# Patient Record
Sex: Male | Born: 1985 | Hispanic: Yes | Marital: Single | Smoking: Current every day smoker
Health system: Southern US, Community
[De-identification: ages and names within clinical notes are randomized; demographics above are authoritative.]

## PROBLEM LIST (undated history)

## (undated) DIAGNOSIS — M6282 Rhabdomyolysis: Secondary | ICD-10-CM

## (undated) DIAGNOSIS — R51 Headache: Secondary | ICD-10-CM

## (undated) DIAGNOSIS — Z87448 Personal history of other diseases of urinary system: Secondary | ICD-10-CM

## (undated) DIAGNOSIS — G43909 Migraine, unspecified, not intractable, without status migrainosus: Secondary | ICD-10-CM

## (undated) DIAGNOSIS — R519 Headache, unspecified: Secondary | ICD-10-CM

## (undated) DIAGNOSIS — N179 Acute kidney failure, unspecified: Secondary | ICD-10-CM

## (undated) HISTORY — PX: NO PAST SURGERIES: SHX2092

---

## 2015-04-29 ENCOUNTER — Emergency Department (HOSPITAL_COMMUNITY)
Admission: EM | Admit: 2015-04-29 | Discharge: 2015-04-29 | Disposition: A | Discharge status: Home / Self Care | Payer: No Typology Code available for payment source | Attending: Emergency Medicine | Admitting: Emergency Medicine

## 2015-04-29 ENCOUNTER — Emergency Department (HOSPITAL_COMMUNITY): Payer: No Typology Code available for payment source

## 2015-04-29 ENCOUNTER — Encounter (HOSPITAL_COMMUNITY): Payer: Self-pay | Admitting: *Deleted

## 2015-04-29 DIAGNOSIS — S8991XA Unspecified injury of right lower leg, initial encounter: Secondary | ICD-10-CM | POA: Diagnosis not present

## 2015-04-29 DIAGNOSIS — Y998 Other external cause status: Secondary | ICD-10-CM | POA: Diagnosis not present

## 2015-04-29 DIAGNOSIS — M25561 Pain in right knee: Secondary | ICD-10-CM

## 2015-04-29 DIAGNOSIS — T148 Other injury of unspecified body region: Secondary | ICD-10-CM | POA: Insufficient documentation

## 2015-04-29 DIAGNOSIS — T148XXA Other injury of unspecified body region, initial encounter: Secondary | ICD-10-CM

## 2015-04-29 DIAGNOSIS — S199XXA Unspecified injury of neck, initial encounter: Secondary | ICD-10-CM | POA: Diagnosis not present

## 2015-04-29 DIAGNOSIS — Y92410 Unspecified street and highway as the place of occurrence of the external cause: Secondary | ICD-10-CM | POA: Diagnosis not present

## 2015-04-29 DIAGNOSIS — Y9389 Activity, other specified: Secondary | ICD-10-CM | POA: Diagnosis not present

## 2015-04-29 MED ORDER — IBUPROFEN 800 MG PO TABS: 800.0000 mg | ORAL_TABLET | Freq: Three times a day (TID) | ORAL | Status: DC

## 2015-04-29 MED ORDER — BACLOFEN 10 MG PO TABS: 10.0000 mg | ORAL_TABLET | Freq: Three times a day (TID) | ORAL | Status: AC

## 2015-04-29 MED ORDER — IBUPROFEN 800 MG PO TABS
800.0000 mg | ORAL_TABLET | Freq: Once | ORAL | Status: AC
  Administered 2015-04-29: 800 mg via ORAL
  Filled 2015-04-29: qty 1

## 2015-04-29 MED ORDER — METHOCARBAMOL 500 MG PO TABS
1000.0000 mg | ORAL_TABLET | Freq: Once | ORAL | Status: AC
  Administered 2015-04-29: 1000 mg via ORAL
  Filled 2015-04-29: qty 2
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
## 2015-05-06 ENCOUNTER — Emergency Department (INDEPENDENT_AMBULATORY_CARE_PROVIDER_SITE_OTHER)
Admission: EM | Admit: 2015-05-06 | Discharge: 2015-05-06 | Disposition: A | Discharge status: Home / Self Care | Payer: Self-pay | Source: Home / Self Care | Attending: Family Medicine | Admitting: Family Medicine

## 2015-05-06 ENCOUNTER — Encounter (HOSPITAL_COMMUNITY): Payer: Self-pay | Admitting: Emergency Medicine

## 2015-05-06 DIAGNOSIS — M62830 Muscle spasm of back: Secondary | ICD-10-CM

## 2015-05-06 DIAGNOSIS — S39012D Strain of muscle, fascia and tendon of lower back, subsequent encounter: Secondary | ICD-10-CM

## 2015-05-06 MED ORDER — METHOCARBAMOL 500 MG PO TABS: 500.0000 mg | ORAL_TABLET | Freq: Three times a day (TID) | ORAL | Status: DC

## 2015-05-06 MED ORDER — METHYLPREDNISOLONE ACETATE 40 MG/ML IJ SUSP
INTRAMUSCULAR | Status: AC
  Filled 2015-05-06: qty 1

## 2015-05-06 MED ORDER — TRAMADOL HCL 50 MG PO TABS
50.0000 mg | ORAL_TABLET | Freq: Four times a day (QID) | ORAL | Status: DC | PRN
Start: 2015-05-06 — End: 2015-07-26

## 2015-05-06 MED ORDER — KETOROLAC TROMETHAMINE 60 MG/2ML IM SOLN
INTRAMUSCULAR | Status: AC
  Filled 2015-05-06: qty 2

## 2015-05-06 MED ORDER — DICLOFENAC POTASSIUM 50 MG PO TABS: 50.0000 mg | ORAL_TABLET | Freq: Three times a day (TID) | ORAL | Status: DC

## 2015-05-06 MED ORDER — KETOROLAC TROMETHAMINE 60 MG/2ML IM SOLN
60.0000 mg | Freq: Once | INTRAMUSCULAR | Status: AC
  Administered 2015-05-06: 60 mg via INTRAMUSCULAR

## 2015-05-06 MED ORDER — METHYLPREDNISOLONE ACETATE 40 MG/ML IJ SUSP
40.0000 mg | Freq: Once | INTRAMUSCULAR | Status: AC
  Administered 2015-05-06: 40 mg via INTRAMUSCULAR
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
## 2015-07-25 ENCOUNTER — Encounter (HOSPITAL_BASED_OUTPATIENT_CLINIC_OR_DEPARTMENT_OTHER): Payer: Self-pay | Admitting: *Deleted

## 2015-07-25 ENCOUNTER — Observation Stay (HOSPITAL_COMMUNITY): Payer: Self-pay

## 2015-07-25 ENCOUNTER — Inpatient Hospital Stay (HOSPITAL_BASED_OUTPATIENT_CLINIC_OR_DEPARTMENT_OTHER)
Admission: EM | Admit: 2015-07-25 | Discharge: 2015-07-27 | DRG: 683 | Disposition: A | Discharge status: Home / Self Care | Payer: Self-pay | Attending: Internal Medicine | Admitting: Internal Medicine

## 2015-07-25 DIAGNOSIS — M6282 Rhabdomyolysis: Secondary | ICD-10-CM

## 2015-07-25 DIAGNOSIS — G43909 Migraine, unspecified, not intractable, without status migrainosus: Secondary | ICD-10-CM | POA: Diagnosis present

## 2015-07-25 DIAGNOSIS — R1032 Left lower quadrant pain: Secondary | ICD-10-CM

## 2015-07-25 DIAGNOSIS — E860 Dehydration: Secondary | ICD-10-CM | POA: Diagnosis present

## 2015-07-25 DIAGNOSIS — Z23 Encounter for immunization: Secondary | ICD-10-CM

## 2015-07-25 DIAGNOSIS — F17210 Nicotine dependence, cigarettes, uncomplicated: Secondary | ICD-10-CM | POA: Diagnosis present

## 2015-07-25 DIAGNOSIS — N179 Acute kidney failure, unspecified: Principal | ICD-10-CM | POA: Diagnosis present

## 2015-07-25 DIAGNOSIS — K529 Noninfective gastroenteritis and colitis, unspecified: Secondary | ICD-10-CM | POA: Diagnosis present

## 2015-07-25 DIAGNOSIS — N400 Enlarged prostate without lower urinary tract symptoms: Secondary | ICD-10-CM | POA: Diagnosis present

## 2015-07-25 HISTORY — DX: Headache: R51

## 2015-07-25 HISTORY — DX: Headache, unspecified: R51.9

## 2015-07-25 HISTORY — DX: Migraine, unspecified, not intractable, without status migrainosus: G43.909

## 2015-07-25 LAB — URINALYSIS, ROUTINE W REFLEX MICROSCOPIC
Glucose, UA: NEGATIVE mg/dL
Hgb urine dipstick: NEGATIVE
KETONES UR: 15 mg/dL — AB
Leukocytes, UA: NEGATIVE
NITRITE: NEGATIVE
PH: 5.5 (ref 5.0–8.0)
Protein, ur: NEGATIVE mg/dL
Specific Gravity, Urine: 1.029 (ref 1.005–1.030)
Urobilinogen, UA: 0.2 mg/dL (ref 0.0–1.0)

## 2015-07-25 LAB — CBC WITH DIFFERENTIAL/PLATELET
BASOS ABS: 0 10*3/uL (ref 0.0–0.1)
BASOS PCT: 0 % (ref 0–1)
Eosinophils Absolute: 0.1 10*3/uL (ref 0.0–0.7)
Eosinophils Relative: 1 % (ref 0–5)
HCT: 45.8 % (ref 39.0–52.0)
Hemoglobin: 16.4 g/dL (ref 13.0–17.0)
LYMPHS PCT: 38 % (ref 12–46)
Lymphs Abs: 2.9 10*3/uL (ref 0.7–4.0)
MCH: 30.9 pg (ref 26.0–34.0)
MCHC: 35.8 g/dL (ref 30.0–36.0)
MCV: 86.4 fL (ref 78.0–100.0)
Monocytes Absolute: 0.6 10*3/uL (ref 0.1–1.0)
Monocytes Relative: 8 % (ref 3–12)
NEUTROS ABS: 4 10*3/uL (ref 1.7–7.7)
Neutrophils Relative %: 53 % (ref 43–77)
Platelets: 368 10*3/uL (ref 150–400)
RBC: 5.3 MIL/uL (ref 4.22–5.81)
RDW: 12.5 % (ref 11.5–15.5)
WBC: 7.7 10*3/uL (ref 4.0–10.5)

## 2015-07-25 LAB — CBC
HCT: 39.2 % (ref 39.0–52.0)
Hemoglobin: 13.8 g/dL (ref 13.0–17.0)
MCH: 31 pg (ref 26.0–34.0)
MCHC: 35.2 g/dL (ref 30.0–36.0)
MCV: 88.1 fL (ref 78.0–100.0)
PLATELETS: 285 10*3/uL (ref 150–400)
RBC: 4.45 MIL/uL (ref 4.22–5.81)
RDW: 12.7 % (ref 11.5–15.5)
WBC: 7.1 10*3/uL (ref 4.0–10.5)

## 2015-07-25 LAB — BASIC METABOLIC PANEL
Anion gap: 12 (ref 5–15)
BUN: 37 mg/dL — ABNORMAL HIGH (ref 6–20)
CALCIUM: 9.7 mg/dL (ref 8.9–10.3)
CO2: 29 mmol/L (ref 22–32)
Chloride: 93 mmol/L — ABNORMAL LOW (ref 101–111)
Creatinine, Ser: 2.09 mg/dL — ABNORMAL HIGH (ref 0.61–1.24)
GFR calc non Af Amer: 41 mL/min — ABNORMAL LOW (ref >60)
GFR, EST AFRICAN AMERICAN: 48 mL/min — AB (ref >60)
Glucose, Bld: 97 mg/dL (ref 65–99)
POTASSIUM: 4 mmol/L (ref 3.5–5.1)
Sodium: 134 mmol/L — ABNORMAL LOW (ref 135–145)

## 2015-07-25 LAB — RAPID URINE DRUG SCREEN, HOSP PERFORMED
AMPHETAMINES: POSITIVE — AB
BENZODIAZEPINES: NOT DETECTED
Barbiturates: NOT DETECTED
COCAINE: NOT DETECTED
Opiates: NOT DETECTED
Tetrahydrocannabinol: NOT DETECTED

## 2015-07-25 LAB — CREATININE, SERUM
CREATININE: 1.42 mg/dL — AB (ref 0.61–1.24)
GFR calc Af Amer: >60 mL/min (ref >60)

## 2015-07-25 LAB — CK: Total CK: 896 U/L — ABNORMAL HIGH (ref 49–397)

## 2015-07-25 MED ORDER — ACETAMINOPHEN 650 MG RE SUPP: 650.0000 mg | Freq: Four times a day (QID) | RECTAL | Status: DC | PRN

## 2015-07-25 MED ORDER — SODIUM CHLORIDE 0.9 % IV SOLN
INTRAVENOUS | Status: AC
  Administered 2015-07-25 – 2015-07-26 (×2): via INTRAVENOUS

## 2015-07-25 MED ORDER — ONDANSETRON HCL 4 MG PO TABS: 4.0000 mg | ORAL_TABLET | Freq: Four times a day (QID) | ORAL | Status: DC | PRN

## 2015-07-25 MED ORDER — SODIUM CHLORIDE 0.9 % IV SOLN: 1000.0000 mL | Freq: Once | INTRAVENOUS | Status: DC

## 2015-07-25 MED ORDER — ONDANSETRON HCL 4 MG/2ML IJ SOLN: 4.0000 mg | Freq: Four times a day (QID) | INTRAMUSCULAR | Status: DC | PRN

## 2015-07-25 MED ORDER — ONDANSETRON HCL 4 MG/2ML IJ SOLN
4.0000 mg | Freq: Once | INTRAMUSCULAR | Status: AC
  Administered 2015-07-25: 4 mg via INTRAVENOUS
  Filled 2015-07-25: qty 2

## 2015-07-25 MED ORDER — ACETAMINOPHEN 325 MG PO TABS: 650.0000 mg | ORAL_TABLET | Freq: Four times a day (QID) | ORAL | Status: DC | PRN

## 2015-07-25 MED ORDER — ENOXAPARIN SODIUM 40 MG/0.4ML ~~LOC~~ SOLN
40.0000 mg | SUBCUTANEOUS | Status: DC
  Administered 2015-07-25 – 2015-07-26 (×2): 40 mg via SUBCUTANEOUS
  Filled 2015-07-25 (×2): qty 0.4

## 2015-07-25 MED ORDER — SODIUM CHLORIDE 0.9 % IV SOLN: INTRAVENOUS | Status: DC

## 2015-07-25 MED ORDER — SODIUM CHLORIDE 0.9 % IV SOLN
1000.0000 mL | INTRAVENOUS | Status: DC
  Administered 2015-07-25 (×2): 1000 mL via INTRAVENOUS

## 2015-07-25 MED ORDER — IOHEXOL 300 MG/ML  SOLN: 25.0000 mL | INTRAMUSCULAR | Status: AC

## 2015-07-25 MED ORDER — SODIUM CHLORIDE 0.9 % IV SOLN
1000.0000 mL | Freq: Once | INTRAVENOUS | Status: AC
  Administered 2015-07-25: 1000 mL via INTRAVENOUS

## 2015-07-25 MED ORDER — PNEUMOCOCCAL VAC POLYVALENT 25 MCG/0.5ML IJ INJ
0.5000 mL | INJECTION | INTRAMUSCULAR | Status: AC
  Administered 2015-07-26: 0.5 mL via INTRAMUSCULAR
  Filled 2015-07-25: qty 0.5

## 2015-07-25 NOTE — ED Notes (Signed)
MD at bedside. 

## 2015-07-25 NOTE — ED Provider Notes (Signed)
CSN: [id number]     Arrival date & time 07/25/15  1630 History   First MD Initiated Contact with Patient 07/25/15 1632     Chief Complaint  Patient presents with  . Headache     (Consider location/radiation/quality/duration/timing/severity/associated sxs/prior Treatment) HPI Comments: 29 year old male presenting with multiple complaints. He's had a headache for the past 2 weeks described as his "brain cramping" in the back of his head with associated nausea and photophobia. He then started to develop generalized abdominal pain over the past week followed by nausea and vomiting beginning yesterday. Reports 2 episodes of vomiting yesterday and 2 episodes of vomiting today, one of which was blood-tinged. Has a decreased appetite. 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 Reports muscle cramping and states his muscles are very tender to touch. No extremity swelling. No rashes. Denies neck pain or stiffness.  Patient is a 29 y.o. male presenting with headaches. The history is provided by the patient.  Headache Associated symptoms: abdominal pain, myalgias, nausea, photophobia and vomiting     History reviewed. No pertinent past medical history. History reviewed. No pertinent past surgical history. No family history on file. Social History  Substance Use Topics  . Smoking status: Current Every Day Smoker    Types: Cigarettes  . Smokeless tobacco: None  . Alcohol Use: Yes    Review of Systems  Constitutional: Positive for appetite change.  Eyes: Positive for photophobia.  Gastrointestinal: Positive for nausea, vomiting and abdominal pain.  Genitourinary: Positive for decreased urine volume.       + Dark urine.  Musculoskeletal: Positive for myalgias.  Neurological: Positive for headaches.  All other systems reviewed and are negative.     Allergies  Review of patient's allergies indicates no known  allergies.  Home Medications   Prior to Admission medications   Medication Sig Start Date End Date Taking? Authorizing Provider  diclofenac (CATAFLAM) 50 MG tablet Take 1 tablet (50 mg total) by mouth 3 (three) times daily. One tablet TID with food prn pain. 05/06/15   Hayden Rasmussen, NP  ibuprofen (ADVIL,MOTRIN) 800 MG tablet Take 1 tablet (800 mg total) by mouth 3 (three) times daily. 04/29/15   Ivery Quale, PA-C  methocarbamol (ROBAXIN) 500 MG tablet Take 1 tablet (500 mg total) by mouth 3 (three) times daily. Prn muscle spasm.  May cause drowsiness. 05/06/15   Hayden Rasmussen, NP  traMADol (ULTRAM) 50 MG tablet Take 1 tablet (50 mg total) by mouth every 6 (six) hours as needed. For back pain. May cause drowsiness. 05/06/15   Hayden Rasmussen, NP   BP 114/74 mmHg  Pulse 80  Temp(Src) 98.1 F (36.7 C) (Oral)  Resp 16  Ht  (1.651 m)  Wt 140 lb (63.504 kg)  BMI 23.30 kg/m2  SpO2 100% Physical Exam  Constitutional: He is oriented to person, place, and time. He appears well-developed and well-nourished. No distress.  HENT:  Head: Normocephalic and atraumatic.  Eyes: Conjunctivae and EOM are normal. Pupils are equal, round, and reactive to light.  Neck: Normal range of motion. Neck supple.  No meningeal signs.  Cardiovascular: Normal rate, regular rhythm and normal heart sounds.   Pulmonary/Chest: Effort normal and breath sounds normal. No respiratory distress.  Abdominal:  Generalized tenderness. "Muscle soreness" per pt.  Musculoskeletal: Normal range of motion. He exhibits no edema.  Generalized tenderness to BL upper and lower extremities. No edema.  Neurological:  He is alert and oriented to person, place, and time. He has normal strength. No cranial nerve deficit. Coordination normal. GCS eye subscore is 4. GCS verbal subscore is 5. GCS motor subscore is 6.  Skin: Skin is warm and dry. No rash noted.  Psychiatric: He has a normal mood and affect. His behavior is normal.  Nursing note and  vitals reviewed.   ED Course  Procedures (including critical care time) Labs Review Labs Reviewed  URINALYSIS, ROUTINE W REFLEX MICROSCOPIC (NOT AT [facility]) - Abnormal; Notable for the following:    Bilirubin Urine SMALL (*)    Ketones, ur 15 (*)    All other components within normal limits  BASIC METABOLIC PANEL - Abnormal; Notable for the following:    Sodium 134 (*)    Chloride 93 (*)    BUN 37 (*)    Creatinine, Ser 2.09 (*)    GFR calc non Af Amer 41 (*)    GFR calc Af Amer 48 (*)    All other components within normal limits  CK - Abnormal; Notable for the following:    Total CK 896 (*)    All other components within normal limits  CBC WITH DIFFERENTIAL/PLATELET    Imaging Review No results found. I have personally reviewed and evaluated these images and lab results as part of my medical decision-making.   EKG Interpretation None      MDM   Final diagnoses:  Acute kidney injury  Non-traumatic rhabdomyolysis   Non-toxic appearing, NAD. AFVSS. Has been outside working in the heat. Labs significant for AKI and concern for developing rhabdo. Will admit to IV hydration and monitoring, observation, medical med. Admission accepted by Dr. Clyde Lundborg, [facility].  Discussed with attending Dr. Rubin Payor who agrees with plan of care.   Kathrynn Speed, PA-C 07/25/15 1841  Benjiman Core, MD 07/25/15 2308

## 2015-07-25 NOTE — Progress Notes (Signed)
CT person here with oral contrast for patient to drink before CT abdomen. Patient eating sub and chips from [employer]. Paged Dr Toniann Fail re: above, MD called me back and said to just get CT without any contrast now. CT person advised of above. States will be back to get patient. Patient notified of impending CT scan with verbal understanding.

## 2015-07-25 NOTE — Progress Notes (Signed)
Patient arrived to room 5W10 from [facility] via [facility], ambulated from stretcher to bed without problems. SR up, call bell in reach. Oriented to room, call bell, bed controls, patient guide book with verbal understanding. New armband applied after verifying info with patient. Admission nurse in room at this time to do admission paperwork. Awaiting MD to see patient and give orders. Will monitor.

## 2015-07-25 NOTE — ED Notes (Signed)
Pt presents with HA, N/V, poor appetite, states been having a lot of leg cramps

## 2015-07-25 NOTE — H&P (Signed)
[facility] History and Physical  Javad Salva ZOX:[id number] DOB: 12-Aug-1986 DOA: 07/25/2015  Referring physician: Patient was transferred from [facility] [place]. PCP: No PCP Per Patient  Specialists: None.  Chief Complaint: Generalized bodyaches nausea vomiting.  HPI: Ricky Thompson is a 29 y.o. male with no significant past medical history presents to the ER because of generalized body ache muscle cramps nausea vomiting over the last 2 days. Patient states he has been having body aches for the last 3 weeks. Nausea vomiting started since yesterday. In the ER patient was found to have elevated creatinine and mildly elevated CK levels. Patient states he was having some dark urine before coming to the ER. Patient also states that a few months ago he was told he had an enlarged prostate. Patient was given IV boluses and admitted for further observation. Patient at this time is able to make urine. Patient denies any chest pain or shortness of breath. Still has cramps in the lower extremities.   Review of Systems: As presented in the history of presenting illness, rest negative.  Past Medical History  Diagnosis Date  . Daily headache   . Migraine     "a few times/week" (07/25/2015)  . Medical history non-contributory    Past Surgical History  Procedure Laterality Date  . No past surgeries     Social History:  reports that he has been smoking Cigarettes.  He has a .84 pack-year smoking history. His smokeless tobacco use includes Chew. He reports that he drinks about 3.6 oz of alcohol per week. He reports that he does not use illicit drugs. Where does patient live home. Can patient participate in ADLs? Yes.  No Known Allergies  Family History:  Family History  Problem Relation Age of Onset  . Diabetes Mellitus II Mother       Prior to Admission medications   Medication Sig Start Date End Date Taking? Authorizing Provider  diclofenac (CATAFLAM) 50 MG tablet Take 1 tablet (50 mg  total) by mouth 3 (three) times daily. One tablet TID with food prn pain. 05/06/15   Hayden Rasmussen, NP  ibuprofen (ADVIL,MOTRIN) 800 MG tablet Take 1 tablet (800 mg total) by mouth 3 (three) times daily. 04/29/15   Ivery Quale, PA-C  methocarbamol (ROBAXIN) 500 MG tablet Take 1 tablet (500 mg total) by mouth 3 (three) times daily. Prn muscle spasm.  May cause drowsiness. 05/06/15   Hayden Rasmussen, NP  traMADol (ULTRAM) 50 MG tablet Take 1 tablet (50 mg total) by mouth every 6 (six) hours as needed. For back pain. May cause drowsiness. 05/06/15   Hayden Rasmussen, NP    Physical Exam: Filed Vitals:   07/25/15 1800 07/25/15 1830 07/25/15 2000 07/25/15 2121  BP: 126/74 128/78 120/73 131/72  Pulse: 82 89 85 85  Temp:    98.2 F (36.8 C)  TempSrc:    Oral  Resp:   18 18  Height:      Weight:      SpO2: 100% 100% 100% 99%     General:  Moderately built and nourished.  Eyes: Anicteric no pallor.  ENT: No discharge from the ears eyes nose and mouth.  Neck: No mass felt.  Cardiovascular: S1 and S2 heard.  Respiratory: No rhonchi or crepitations.  Abdomen: Soft mild tenderness in the lower quadrants. No guarding or rigidity.  Skin: No rash.  Musculoskeletal: Nontender no edema.  Psychiatric: Appears normal.  Neurologic: Alert awake oriented to time place and person. Moves all extremities.  Labs on Admission:  Basic Metabolic Panel:  Recent Labs Lab 07/25/15 1700  NA 134*  K 4.0  CL 93*  CO2 29  GLUCOSE 97  BUN 37*  CREATININE 2.09*  CALCIUM 9.7   Liver Function Tests: No results for input(s): AST, ALT, ALKPHOS, BILITOT, PROT, ALBUMIN in the last 168 hours. No results for input(s): LIPASE, AMYLASE in the last 168 hours. No results for input(s): AMMONIA in the last 168 hours. CBC:  Recent Labs Lab 07/25/15 1700  WBC 7.7  NEUTROABS 4.0  HGB 16.4  HCT 45.8  MCV 86.4  PLT 368   Cardiac Enzymes:  Recent Labs Lab 07/25/15 1720  CKTOTAL 896*    BNP (last 3  results) No results for input(s): BNP in the last 8760 hours.  ProBNP (last 3 results) No results for input(s): PROBNP in the last 8760 hours.  CBG: No results for input(s): GLUCAP in the last 168 hours.  Radiological Exams on Admission: No results found.   Assessment/Plan Principal Problem:   Acute kidney injury Active Problems:   Non-traumatic rhabdomyolysis   ARF (acute renal failure)   1. Acute renal failure - probably secondary to dehydration and nausea vomiting. Continue with aggressive IV hydration. Follow metabolic panel intake output. 2. Nontraumatic rhabdomyolysis - continue with hydration and follow CK levels.  Since patient is having mild tenderness in all quadrants I have ordered CT abdomen and pelvis with by mouth contrast. Patient states he was told he had enlarged prostate for which she will need further workup as outpatient. Follow CT.   DVT Prophylaxis Lovenox.  Code Status: Full code.  Family Communication: Discussed with patient.  Disposition Plan: Admit for observation.    Cerissa Zeiger N. [facility] Pager [phone].  If 7PM-7AM, please contact night-coverage [email or web address] Password [facility] 07/25/2015, 10:25 PM

## 2015-07-25 NOTE — ED Notes (Signed)
rn on 5 w unable to take report at present

## 2015-07-25 NOTE — ED Notes (Signed)
Pt. Reports he has been cramping for 3 weeks and started vomiting yesterday and now is having headaches and nausea.  Pt. Reports no diarrhea.  Pt. Reports not eating well today.  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

## 2015-07-26 LAB — CBC WITH DIFFERENTIAL/PLATELET
BASOS ABS: 0 10*3/uL (ref 0.0–0.1)
Basophils Relative: 0 % (ref 0–1)
EOS PCT: 3 % (ref 0–5)
Eosinophils Absolute: 0.2 10*3/uL (ref 0.0–0.7)
HCT: 39.4 % (ref 39.0–52.0)
Hemoglobin: 13.6 g/dL (ref 13.0–17.0)
LYMPHS PCT: 58 % — AB (ref 12–46)
Lymphs Abs: 3.3 10*3/uL (ref 0.7–4.0)
MCH: 31 pg (ref 26.0–34.0)
MCHC: 34.5 g/dL (ref 30.0–36.0)
MCV: 89.7 fL (ref 78.0–100.0)
Monocytes Absolute: 0.4 10*3/uL (ref 0.1–1.0)
Monocytes Relative: 7 % (ref 3–12)
NEUTROS ABS: 1.9 10*3/uL (ref 1.7–7.7)
Neutrophils Relative %: 32 % — ABNORMAL LOW (ref 43–77)
PLATELETS: 273 10*3/uL (ref 150–400)
RBC: 4.39 MIL/uL (ref 4.22–5.81)
RDW: 12.7 % (ref 11.5–15.5)
WBC: 5.8 10*3/uL (ref 4.0–10.5)

## 2015-07-26 LAB — COMPREHENSIVE METABOLIC PANEL
ALT: 22 U/L (ref 17–63)
ANION GAP: 6 (ref 5–15)
AST: 26 U/L (ref 15–41)
Albumin: 3.6 g/dL (ref 3.5–5.0)
Alkaline Phosphatase: 52 U/L (ref 38–126)
BILIRUBIN TOTAL: 0.8 mg/dL (ref 0.3–1.2)
BUN: 23 mg/dL — AB (ref 6–20)
CO2: 27 mmol/L (ref 22–32)
Calcium: 8.2 mg/dL — ABNORMAL LOW (ref 8.9–10.3)
Chloride: 102 mmol/L (ref 101–111)
Creatinine, Ser: 1.29 mg/dL — ABNORMAL HIGH (ref 0.61–1.24)
GFR calc Af Amer: >60 mL/min (ref >60)
Glucose, Bld: 94 mg/dL (ref 65–99)
POTASSIUM: 4.2 mmol/L (ref 3.5–5.1)
Sodium: 135 mmol/L (ref 135–145)
TOTAL PROTEIN: 5.7 g/dL — AB (ref 6.5–8.1)

## 2015-07-26 LAB — RAPID URINE DRUG SCREEN, HOSP PERFORMED
AMPHETAMINES: POSITIVE — AB
BARBITURATES: POSITIVE — AB
BENZODIAZEPINES: NOT DETECTED
Cocaine: NOT DETECTED
Opiates: NOT DETECTED
TETRAHYDROCANNABINOL: NOT DETECTED

## 2015-07-26 LAB — LIPASE, BLOOD: Lipase: 38 U/L (ref 22–51)

## 2015-07-26 LAB — CK: CK TOTAL: 777 U/L — AB (ref 49–397)

## 2015-07-26 MED ORDER — BUTALBITAL-APAP-CAFFEINE 50-325-40 MG PO TABS
1.0000 | ORAL_TABLET | ORAL | Status: DC | PRN
  Administered 2015-07-26 (×2): 1 via ORAL
  Filled 2015-07-26 (×2): qty 1

## 2015-07-26 MED ORDER — METOCLOPRAMIDE HCL 5 MG/ML IJ SOLN
10.0000 mg | Freq: Once | INTRAMUSCULAR | Status: AC
  Administered 2015-07-26: 10 mg via INTRAVENOUS
  Filled 2015-07-26: qty 2

## 2015-07-26 NOTE — Progress Notes (Signed)
[facility] PROGRESS NOTE  Kyon Bentler QMV:[id number] DOB: 13-Jun-1986 DOA: 07/25/2015 PCP: No PCP Per Patient  Assessment/Plan: Principal Problem:   Acute kidney injury Active Problems:   Non-traumatic rhabdomyolysis   ARF (acute renal failure)    Acute renal failure, secondary to rhabdomyolysis-improving, continue IV fluids  Abdominal pain, nausea vomiting Unremarkable noncontrast CT of the abdomen and pelvis. Could Be secondary to mild viral gastroenteritis. Patient denies using marijuana,    rhabdomyolysis-CK is improving, will recheck in the morning,   Code Status:      Code Status Orders        Start     Ordered   07/25/15 2224  Full code   Continuous     07/25/15 2224     Family Communication: family updated about patient's clinical progress Disposition Plan: Anticipate discharge tomorrow   Brief narrative: Ricky Thompson is a 29 y.o. male with no significant past medical history presents to the ER because of generalized body ache muscle cramps nausea vomiting over the last 2 days. Patient states he has been having body aches for the last 3 weeks. Nausea vomiting started since yesterday. In the ER patient was found to have elevated creatinine and mildly elevated CK levels. Patient states he was having some dark urine before coming to the ER. Patient also states that a few months ago he was told he had an enlarged prostate. Patient was given IV boluses and admitted for further observation. Patient at this time is able to make urine. Patient denies any chest pain or shortness of breath. Still has cramps in the lower extremities.       Consultants:  None  Procedures:  None  Antibiotics: Anti-infectives    None         HPI/Subjective: Patient states that he feels better, still having some diffuse myalgias anticipate discharge tomorrow  Objective: Filed Vitals:   07/25/15 1830 07/25/15 2000 07/25/15 2121 07/26/15 0508  BP: 128/78 120/73 131/72  106/56  Pulse: 89 85 85 58  Temp:   98.2 F (36.8 C) 98 F (36.7 C)  TempSrc:   Oral Oral  Resp:  18 18 18   Height:   5\' 5"  (1.651 m)   Weight:   65.409 kg (144 lb 3.2 oz) 65.227 kg (143 lb 12.8 oz)  SpO2: 100% 100% 99% 98%    Intake/Output Summary (Last 24 hours) at 07/26/15 1422 Last data filed at 07/26/15 0500  Gross per 24 hour  Intake   4250 ml  Output    325 ml  Net   3925 ml    Exam:  General: No acute respiratory distress Lungs: Clear to auscultation bilaterally without wheezes or crackles Cardiovascular: Regular rate and rhythm without murmur gallop or rub normal S1 and S2 Abdomen: Nontender, nondistended, soft, bowel sounds positive, no rebound, no ascites, no appreciable mass Extremities: No significant cyanosis, clubbing, or edema bilateral lower extremities     Data Review   Micro Results No results found for this or any previous visit (from the past 240 hour(s)).  Radiology Reports Ct Abdomen Pelvis Wo Contrast  07/26/2015   CLINICAL DATA:  Acute onset of severe mid and lower abdominal pain for 2 days. Nausea and vomiting. Initial encounter.  EXAM: CT ABDOMEN AND PELVIS WITHOUT CONTRAST  TECHNIQUE: Multidetector CT imaging of the abdomen and pelvis was performed following the standard protocol without IV contrast.  COMPARISON:  None.  FINDINGS: The visualized lung bases are clear.  The  liver and spleen are unremarkable in appearance. A benign-appearing calcification is noted posterior to the upper pole of the right kidney. The gallbladder is within normal limits. The pancreas and adrenal glands are unremarkable.  The kidneys are unremarkable in appearance. There is no evidence of hydronephrosis. No renal or ureteral stones are seen. No perinephric stranding is appreciated.  No free fluid is identified. The small bowel is unremarkable in appearance. The stomach is within normal limits. No acute vascular abnormalities are seen.  The appendix is normal in caliber,  with apparent minimal appendicolith along the the base of the appendix. There is no evidence for appendicitis. The colon is unremarkable in appearance.  The bladder is mildly distended and grossly unremarkable. The prostate remains normal in size. No inguinal lymphadenopathy is seen.  No acute osseous abnormalities are identified.  IMPRESSION: Unremarkable noncontrast CT of the abdomen and pelvis.   Electronically Signed   By: Roanna Raider M.D.   On: 07/26/2015 03:02     CBC  Recent Labs Lab 07/25/15 1700 07/25/15 2300 07/26/15 0533  WBC 7.7 7.1 5.8  HGB 16.4 13.8 13.6  HCT 45.8 39.2 39.4  PLT 368 285 273  MCV 86.4 88.1 89.7  MCH 30.9 31.0 31.0  MCHC 35.8 35.2 34.5  RDW 12.5 12.7 12.7  LYMPHSABS 2.9  --  3.3  MONOABS 0.6  --  0.4  EOSABS 0.1  --  0.2  BASOSABS 0.0  --  0.0    Chemistries   Recent Labs Lab 07/25/15 1700 07/25/15 2300 07/26/15 0533  NA 134*  --  135  K 4.0  --  4.2  CL 93*  --  102  CO2 29  --  27  GLUCOSE 97  --  94  BUN 37*  --  23*  CREATININE 2.09* 1.42* 1.29*  CALCIUM 9.7  --  8.2*  AST  --   --  26  ALT  --   --  22  ALKPHOS  --   --  52  BILITOT  --   --  0.8   ------------------------------------------------------------------------------------------------------------------ estimated creatinine clearance is 74.2 mL/min (by C-G formula based on Cr of 1.29). ------------------------------------------------------------------------------------------------------------------ No results for input(s): HGBA1C in the last 72 hours. ------------------------------------------------------------------------------------------------------------------ No results for input(s): CHOL, HDL, LDLCALC, TRIG, CHOLHDL, LDLDIRECT in the last 72 hours. ------------------------------------------------------------------------------------------------------------------ No results for input(s): TSH, T4TOTAL, T3FREE, THYROIDAB in the last 72 hours.  Invalid input(s):  FREET3 ------------------------------------------------------------------------------------------------------------------ No results for input(s): VITAMINB12, FOLATE, FERRITIN, TIBC, IRON, RETICCTPCT in the last 72 hours.  Coagulation profile No results for input(s): INR, PROTIME in the last 168 hours.  No results for input(s): DDIMER in the last 72 hours.  Cardiac Enzymes No results for input(s): CKMB, TROPONINI, MYOGLOBIN in the last 168 hours.  Invalid input(s): CK ------------------------------------------------------------------------------------------------------------------ Invalid input(s): POCBNP   CBG: No results for input(s): GLUCAP in the last 168 hours.     Studies: Ct Abdomen Pelvis Wo Contrast  07/26/2015   CLINICAL DATA:  Acute onset of severe mid and lower abdominal pain for 2 days. Nausea and vomiting. Initial encounter.  EXAM: CT ABDOMEN AND PELVIS WITHOUT CONTRAST  TECHNIQUE: Multidetector CT imaging of the abdomen and pelvis was performed following the standard protocol without IV contrast.  COMPARISON:  None.  FINDINGS: The visualized lung bases are clear.  The liver and spleen are unremarkable in appearance. A benign-appearing calcification is noted posterior to the upper pole of the right kidney. The gallbladder is within normal limits. The pancreas  and adrenal glands are unremarkable.  The kidneys are unremarkable in appearance. There is no evidence of hydronephrosis. No renal or ureteral stones are seen. No perinephric stranding is appreciated.  No free fluid is identified. The small bowel is unremarkable in appearance. The stomach is within normal limits. No acute vascular abnormalities are seen.  The appendix is normal in caliber, with apparent minimal appendicolith along the the base of the appendix. There is no evidence for appendicitis. The colon is unremarkable in appearance.  The bladder is mildly distended and grossly unremarkable. The prostate remains  normal in size. No inguinal lymphadenopathy is seen.  No acute osseous abnormalities are identified.  IMPRESSION: Unremarkable noncontrast CT of the abdomen and pelvis.   Electronically Signed   By: Roanna Raider M.D.   On: 07/26/2015 03:02      No results found for: HGBA1C Lab Results  Component Value Date   CREATININE 1.29* 07/26/2015       Scheduled Meds: . enoxaparin (LOVENOX) injection  40 mg Subcutaneous Q24H   Continuous Infusions: . sodium chloride 125 mL/hr at 07/26/15 1610    Principal Problem:   Acute kidney injury Active Problems:   Non-traumatic rhabdomyolysis   ARF (acute renal failure)    Time spent: 45 minutes   [facility]  [facility] Pager [phone]. If 7PM-7AM, please contact night-coverage at [email or web address], password [facility] 07/26/2015, 2:22 PM  LOS: 0 days

## 2015-07-26 NOTE — Progress Notes (Signed)
Patient received pamphlet from [facility]. CM explained to patient that they may use the on site pharmacy to fill prescriptions given to them at discharge. Patient aware that the [facility] pharmacy will not fill narcotics or pain medications prior to the patient being seen by one of their physicians.  Patient aware that they must be seen as a patient prior to the pharmacy filling the prescriptions a second time. Follow up appointment date and time entered into AVS. Follow up will be at the [facility].

## 2015-07-26 NOTE — Care Management Note (Signed)
Case Management Note  Patient Details  Name: Ricky Thompson MRN: [id number] Date of Birth: [birth year]  Subjective/Objective:                 Patient fro home with spouse. No insurance or PCP, appointment made with [facility]. Pamphlet given. Patient verbalized understanding.   Action/Plan:  Will continue to follow and offer resources as needed.  Expected Discharge Date:                  Expected Discharge Plan:  Home/Self Care  In-House Referral:     Discharge planning Services  CM Consult, [facility]igent [facility]  Post Acute Care Choice:    Choice offered to:     DME Arranged:    DME Agency:     HH Arranged:    HH Agency:     Status of Service:  Completed, signed off  Medicare Important Message Given:    Date Medicare IM Given:    Medicare IM give by:    Date Additional Medicare IM Given:    Additional Medicare Important Message give by:     If discussed at Long Length of Stay Meetings, dates discussed:    Additional Comments:  Lawerance Sabal, RN 07/26/2015, 11:59 AM

## 2015-07-27 DIAGNOSIS — N179 Acute kidney failure, unspecified: Principal | ICD-10-CM

## 2015-07-27 DIAGNOSIS — M6282 Rhabdomyolysis: Secondary | ICD-10-CM

## 2015-07-27 LAB — COMPREHENSIVE METABOLIC PANEL
ALT: 21 U/L (ref 17–63)
ANION GAP: 5 (ref 5–15)
AST: 23 U/L (ref 15–41)
Albumin: 3.3 g/dL — ABNORMAL LOW (ref 3.5–5.0)
Alkaline Phosphatase: 43 U/L (ref 38–126)
BUN: 14 mg/dL (ref 6–20)
CHLORIDE: 108 mmol/L (ref 101–111)
CO2: 27 mmol/L (ref 22–32)
Calcium: 8.6 mg/dL — ABNORMAL LOW (ref 8.9–10.3)
Creatinine, Ser: 0.92 mg/dL (ref 0.61–1.24)
Glucose, Bld: 92 mg/dL (ref 65–99)
POTASSIUM: 4.5 mmol/L (ref 3.5–5.1)
Sodium: 140 mmol/L (ref 135–145)
TOTAL PROTEIN: 5.1 g/dL — AB (ref 6.5–8.1)
Total Bilirubin: 0.6 mg/dL (ref 0.3–1.2)

## 2015-07-27 LAB — CK: CK TOTAL: 471 U/L — AB (ref 49–397)

## 2015-07-27 MED ORDER — ACETAMINOPHEN 325 MG PO TABS: 650.0000 mg | ORAL_TABLET | Freq: Four times a day (QID) | ORAL | Status: AC | PRN

## 2015-07-27 NOTE — Discharge Summary (Signed)
Physician Discharge Summary  Ricky Thompson ZOX:[id number] DOB: June 04, 1986 DOA: 07/25/2015  PCP: No PCP Per Patient  Admit date: 07/25/2015 Discharge date: 07/27/2015  Time spent: >35 minutes  Recommendations for Outpatient Follow-up:  F/u with PCP in 1-2 weeks as needed  Discharge Diagnoses:  Principal Problem:   Acute kidney injury Active Problems:   Non-traumatic rhabdomyolysis   ARF (acute renal failure)   Discharge Condition: stable   Diet recommendation: regular   Filed Weights   07/25/15 2121 07/26/15 0508 07/27/15 0721  Weight: 65.409 kg (144 lb 3.2 oz) 65.227 kg (143 lb 12.8 oz) 67.813 kg (149 lb 8 oz)    History of present illness:  29 y.o. male with no significant past medical history presents to the ER because of generalized body ache muscle cramps nausea vomiting over the last 2 days. In the ER patient was found to have elevated creatinine and mildly elevated CK levels.  Hospital Course:  1. AKI. Mild rhabdomyolysis. Dehydration  -Resolved with IVF. ? Related to substance abuse. D/w patient, counseled to avoid drug use   2. Nausea, vomiting. Thought possible gastroenteritis vs substance abuse.  -symptoms->resolved   Procedures:  None  (i.e. Studies not automatically included, echos, thoracentesis, etc; not x-rays)  Consultations:  none  Discharge Exam: Filed Vitals:   07/27/15 0532  BP: 109/57  Pulse: 53  Temp: 97.6 F (36.4 C)  Resp: 13    General: alert, no distress  Cardiovascular: s1,s2 rrr Respiratory: CTA BL  Discharge Instructions  Discharge Instructions    Diet - low sodium heart healthy    Complete by:  As directed      Discharge instructions    Complete by:  As directed   Please follow up with primary care doctor in 1-2 weeks as needed     Increase activity slowly    Complete by:  As directed             Medication List    STOP taking these medications        ibuprofen 800 MG tablet  Commonly known as:  ADVIL,MOTRIN      oxyCODONE-acetaminophen 10-325 MG per tablet  Commonly known as:  PERCOCET      TAKE these medications        acetaminophen 325 MG tablet  Commonly known as:  TYLENOL  Take 2 tablets (650 mg total) by mouth every 6 (six) hours as needed for mild pain (or Fever >/= 101).       No Known Allergies     Follow-up Information    Follow up with [facility]     On 08/02/2015.   Why:  Appointment at 11:00. Please bring photo ID. Please go the [facility] at [address] (by [address]) for this first appointment.    Contact information:   [address] [place] [address] [phone]       The results of significant diagnostics from this hospitalization (including imaging, microbiology, ancillary and laboratory) are listed below for reference.    Significant Diagnostic Studies: Ct Abdomen Pelvis Wo Contrast  07/26/2015   CLINICAL DATA:  Acute onset of severe mid and lower abdominal pain for 2 days. Nausea and vomiting. Initial encounter.  EXAM: CT ABDOMEN AND PELVIS WITHOUT CONTRAST  TECHNIQUE: Multidetector CT imaging of the abdomen and pelvis was performed following the standard protocol without IV contrast.  COMPARISON:  None.  FINDINGS: The visualized lung bases are clear.  The liver and spleen are unremarkable in appearance. A benign-appearing calcification is noted posterior to the upper pole of the right kidney. The gallbladder is within normal limits. The pancreas and adrenal glands are unremarkable.  The kidneys are unremarkable in appearance. There is no evidence of hydronephrosis. No renal or ureteral stones are seen. No perinephric stranding is appreciated.  No free fluid is identified. The small bowel is unremarkable in appearance. The stomach is within normal limits. No acute vascular abnormalities are seen.  The appendix is normal in caliber, with apparent minimal appendicolith along the the base of the appendix.  There is no evidence for appendicitis. The colon is unremarkable in appearance.  The bladder is mildly distended and grossly unremarkable. The prostate remains normal in size. No inguinal lymphadenopathy is seen.  No acute osseous abnormalities are identified.  IMPRESSION: Unremarkable noncontrast CT of the abdomen and pelvis.   Electronically Signed   By: Roanna Raider M.D.   On: 07/26/2015 03:02    Microbiology: No results found for this or any previous visit (from the past 240 hour(s)).   Labs: Basic Metabolic Panel:  Recent Labs Lab 07/25/15 1700 07/25/15 2300 07/26/15 0533 07/27/15 0601  NA 134*  --  135 140  K 4.0  --  4.2 4.5  CL 93*  --  102 108  CO2 29  --  27 27  GLUCOSE 97  --  94 92  BUN 37*  --  23* 14  CREATININE 2.09* 1.42* 1.29* 0.92  CALCIUM 9.7  --  8.2* 8.6*   Liver Function Tests:  Recent Labs Lab 07/26/15 0533 07/27/15 0601  AST 26 23  ALT 22 21  ALKPHOS 52 43  BILITOT 0.8 0.6  PROT 5.7* 5.1*  ALBUMIN 3.6 3.3*    Recent Labs Lab 07/26/15 0553  LIPASE 38   No results for input(s): AMMONIA in the last 168 hours. CBC:  Recent Labs Lab 07/25/15 1700 07/25/15 2300 07/26/15 0533  WBC 7.7 7.1 5.8  NEUTROABS 4.0  --  1.9  HGB 16.4 13.8 13.6  HCT 45.8 39.2 39.4  MCV 86.4 88.1 89.7  PLT 368 285 273   Cardiac Enzymes:  Recent Labs Lab 07/25/15 1720 07/26/15 0533 07/27/15 0601  CKTOTAL 896* 777* 471*   BNP: BNP (last 3 results) No results for input(s): BNP in the last 8760 hours.  ProBNP (last 3 results) No results for input(s): PROBNP in the last 8760 hours.  CBG: No results for input(s): GLUCAP in the last 168 hours.     SignedEsperanza Sheets  [facility] 07/27/2015, 9:01 AM

## 2015-07-27 NOTE — Progress Notes (Signed)
Marcine Matar to be D/C'd Home per MD order.  Discussed with the patient and all questions fully answered.  VSS, Skin clean, dry and intact without evidence of skin break down, no evidence of skin tears noted. IV catheter discontinued intact. Site without signs and symptoms of complications. Dressing and pressure applied.  An After Visit Summary was printed and given to the patient. Patient received prescription.  D/c education completed with patient/family including follow up instructions, medication list, d/c activities limitations if indicated, with other d/c instructions as indicated by MD - patient able to verbalize understanding, all questions fully answered.   Patient instructed to return to ED, call 911, or call MD for any changes in condition.   Patient D/C home via private auto.  Beckey Downing F 07/27/2015 11:02 AM

## 2015-08-02 ENCOUNTER — Ambulatory Visit: Payer: Self-pay | Admitting: Family Medicine

## 2016-06-18 ENCOUNTER — Encounter (HOSPITAL_COMMUNITY): Payer: Self-pay | Admitting: *Deleted

## 2016-06-19 ENCOUNTER — Ambulatory Visit (HOSPITAL_COMMUNITY)
Admission: RE | Admit: 2016-06-19 | Payer: Worker's Compensation | Source: Ambulatory Visit | Admitting: Orthopedic Surgery

## 2016-06-19 HISTORY — DX: Rhabdomyolysis: M62.82

## 2016-06-19 HISTORY — DX: Acute kidney failure, unspecified: N17.9

## 2016-06-19 HISTORY — DX: Personal history of other diseases of urinary system: Z87.448

## 2016-06-19 SURGERY — OPEN REDUCTION INTERNAL FIXATION (ORIF) PROXIMAL PHALANX
Anesthesia: General | Laterality: Right
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
